# Patient Record
Sex: Male | Born: 1955 | Race: Black or African American | Hispanic: No | Marital: Married | Smoking: Never smoker
Health system: Southern US, Community
[De-identification: ages and names within clinical notes are randomized; demographics above are authoritative.]

## PROBLEM LIST (undated history)

## (undated) HISTORY — PX: HIP SURGERY: SHX245

---

## 2002-01-06 ENCOUNTER — Encounter: Payer: Self-pay | Admitting: Family Medicine

## 2002-01-06 ENCOUNTER — Ambulatory Visit (HOSPITAL_COMMUNITY): Admission: RE | Admit: 2002-01-06 | Discharge: 2002-01-06 | Payer: Self-pay | Admitting: Family Medicine

## 2010-07-03 ENCOUNTER — Encounter: Payer: Self-pay | Admitting: Family Medicine

## 2012-04-01 ENCOUNTER — Ambulatory Visit: Payer: BC Managed Care – PPO

## 2012-04-01 ENCOUNTER — Ambulatory Visit (INDEPENDENT_AMBULATORY_CARE_PROVIDER_SITE_OTHER): Payer: BC Managed Care – PPO | Admitting: Internal Medicine

## 2012-04-01 VITALS — BP 130/74 | HR 50 | Temp 98.2°F | Resp 16 | Ht 69.0 in | Wt 205.2 lb

## 2012-04-01 DIAGNOSIS — M25559 Pain in unspecified hip: Secondary | ICD-10-CM

## 2012-04-01 DIAGNOSIS — Z96649 Presence of unspecified artificial hip joint: Secondary | ICD-10-CM

## 2012-04-01 NOTE — Progress Notes (Signed)
  Subjective:    Patient ID: Spencer Campbell, male    DOB: [birth year], 56 y.o.   MRN: [id number]  CC: 56 yo AA male c/o R hip pain.  HPI Pt. Had his R hip resurfaced 11/28/10.  Soon after the sx he was diagnosed with a hairline fx of the femur.  Recently (2 mths ago) he started a jogging program, and over the past 2 weeks he has noticed worsening pain in his R hip.  The onset of pain is with initiating movement, such as standing from a seated position.  He describes the pain as sharp then stiff.  The pain resolves with further movement and does not bother him while running.  The pt is concerned that he may have another hairline fracture and requests X-ray.  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 Vitals:  Filed Vitals:   04/01/12 1602  BP: 130/74  Pulse: 50  Temp: 98.2 F (36.8 C)  Resp: 16  HEENT: nontraumatic, EOMIT, normal to external exam, trachea midline Heart: bradycardic Lungs: No respiratory distress, no audible wheezing MSK: Normal bulk and tone.  R HIP: Pain in groin with FABER, decreased flexion strength compared to L. Neuro: Alert, oriented, CN II - XII grossly IT, LE reflexes 2/4. Gait: Hesitation/guarding when transferring from seated to standing position.  Initially walks with limp/decreased R sided weight bearing time, And returns to normal gait after 20 steps.    [facility] reading (PRIMARY) by  Dr. Elis Rawlinson=Lots of chronic changes in the area of surgery but no definite abnormality seen      Assessment & Plan:  1) tendonopathy  of hip flexors:  -continue jogging program -add stretching and ice as directed -patient education regarding diagnosis - RTC in 4-6 wks if not resolved or if not making graded improvement -If not better-back to ortho at [facility]

## 2012-04-02 ENCOUNTER — Telehealth: Payer: Self-pay | Admitting: Internal Medicine

## 2012-04-02 NOTE — Telephone Encounter (Signed)
Patient had xrays, needs to be advised of report. Have called numbers given and unable to get through. [phone] is another # listed on his paperwork from yesterday. Have left message for him to call back about this.

## 2012-04-03 ENCOUNTER — Telehealth: Payer: Self-pay | Admitting: Family Medicine

## 2012-04-03 NOTE — Telephone Encounter (Signed)
Advised pt of the results and inst's to f/up w/ortho Careers adviser. Pt agreed. Faxed copy of report to pt at his request.

## 2012-04-03 NOTE — Telephone Encounter (Signed)
Left message to call back  

## 2012-04-03 NOTE — Telephone Encounter (Signed)
See other message-needs f/u his ortho because of possible separation of prosthesis

## 2012-04-03 NOTE — Telephone Encounter (Signed)
Someone already spoke with patient about this matter

## 2012-04-03 NOTE — Telephone Encounter (Signed)
Dr Merla Riches, can you please comment on x-ray report so that I can call the pt back. It looks like there is no evidence of any hairline fx, but it mentions a sclerotic lesion. Please advise. Pt would like a call back at work # [phone] and have him paged.

## 2012-06-19 ENCOUNTER — Ambulatory Visit (INDEPENDENT_AMBULATORY_CARE_PROVIDER_SITE_OTHER): Payer: BC Managed Care – PPO | Admitting: Emergency Medicine

## 2012-06-19 VITALS — BP 112/64 | HR 109 | Temp 101.5°F | Resp 16 | Ht 69.0 in | Wt 199.0 lb

## 2012-06-19 DIAGNOSIS — R05 Cough: Secondary | ICD-10-CM

## 2012-06-19 DIAGNOSIS — J111 Influenza due to unidentified influenza virus with other respiratory manifestations: Secondary | ICD-10-CM

## 2012-06-19 DIAGNOSIS — R509 Fever, unspecified: Secondary | ICD-10-CM

## 2012-06-19 LAB — POCT INFLUENZA A/B
Influenza A, POC: POSITIVE
Influenza B, POC: NEGATIVE

## 2012-06-19 MED ORDER — OSELTAMIVIR PHOSPHATE 75 MG PO CAPS: 75.0000 mg | ORAL_CAPSULE | Freq: Two times a day (BID) | ORAL | Status: DC

## 2012-06-19 MED ORDER — HYDROCOD POLST-CHLORPHEN POLST 10-8 MG/5ML PO LQCR: 5.0000 mL | Freq: Two times a day (BID) | ORAL | Status: DC | PRN

## 2012-06-19 NOTE — Patient Instructions (Signed)

## 2012-06-19 NOTE — Progress Notes (Signed)
[facility] [address] [place] [address] [phone]  Date:  06/19/2012   Name:  Spencer Campbell   DOB:  Mar 29, 1956   MRN:  [id number]  PCP:  No primary provider on file.    Chief Complaint: Cough and Fever   History of Present Illness:  Spencer Campbell is a 57 y.o. very pleasant male patient who presents with the following:  Ill since yesterday with fever and chills, malaise, myalgias.  Has a cough that is not productive.  No wheezing or shortness of breath.  No nausea or vomiting.  No improvement with OTC meds.  Exposed to flu extensively at work.  Patient Active Problem List  Diagnosis  . S/P hip replacement    History reviewed. No pertinent past medical history.  Past Surgical History  Procedure Date  . Hip surgery     History  Substance Use Topics  . Smoking status: Never Smoker   . Smokeless tobacco: Not on file  . Alcohol Use: Not on file    History reviewed. No pertinent family history.  No Known Allergies  Medication list has been reviewed and updated.  No current outpatient prescriptions on file prior to visit.    Review of Systems:  As per HPI, otherwise negative.    Physical Examination: Filed Vitals:   06/19/12 1533  BP: 112/64  Pulse: 109  Temp: 101.5 F (38.6 C)  Resp: 16   Filed Vitals:   06/19/12 1533  Height: 5\' 9"  (1.753 m)  Weight: 199 lb (90.266 kg)   Body mass index is 29.39 kg/(m^2). Ideal Body Weight: Weight in (lb) to have BMI = 25: 168.9   GEN: WDWN, NAD, Non-toxic, A & O x 3 HEENT: Atraumatic, Normocephalic. Neck supple. No masses, No LAD. Ears and Nose: No external deformity. CV: RRR, No M/G/R. No JVD. No thrill. No extra heart sounds. PULM: CTA B, no wheezes, crackles, rhonchi. No retractions. No resp. distress. No accessory muscle use. ABD: S, NT, ND, +BS. No rebound. No HSM. EXTR: No c/c/e NEURO Normal gait.  PSYCH: Normally interactive. Conversant. Not depressed or anxious  appearing.  Calm demeanor.    Assessment and Plan: Influenza tamiflu tussionex   Carmelina Dane, MD  Results for orders placed in visit on 06/19/12  POCT INFLUENZA A/B      Component Value Range   Influenza A, POC Positive     Influenza B, POC Negative

## 2012-06-26 ENCOUNTER — Other Ambulatory Visit: Payer: Self-pay | Admitting: Emergency Medicine

## 2012-06-27 ENCOUNTER — Other Ambulatory Visit: Payer: Self-pay | Admitting: Emergency Medicine

## 2012-06-27 NOTE — Telephone Encounter (Signed)
AT THE PHARMACY, REFILL WAS DENIED BUT WAS TOLD BY PHARMACIST THAT A NEW RX WAS TO COME THRU FROM OUR OFFICE BUT HAS NOT COME YET. COUGH SYRUP....  CALLBACK #- [phone]

## 2012-06-28 ENCOUNTER — Telehealth: Payer: Self-pay

## 2012-06-28 ENCOUNTER — Other Ambulatory Visit: Payer: Self-pay

## 2012-06-28 NOTE — Telephone Encounter (Signed)
Refill is ok with me.  thanks

## 2012-06-28 NOTE — Telephone Encounter (Signed)
PT CALLING ABOUT RX AT [facility] THAT WAS NOT THERE WHEN HE WENT TO PICK IT UP; PHARMACY SAID OUR OFFICE WAS SENDING ANOTHER COPY OVER BUT IT HASNT BEEN RECEIVED YET.   [phone]

## 2012-06-28 NOTE — Telephone Encounter (Signed)
This one also routed to the Rx refill area, instead of patient calls.

## 2012-06-28 NOTE — Telephone Encounter (Signed)
This was already sent in my rx request.

## 2012-06-30 NOTE — Telephone Encounter (Signed)
tussionex was sent, left message for patient to advise.

## 2014-09-11 ENCOUNTER — Ambulatory Visit (INDEPENDENT_AMBULATORY_CARE_PROVIDER_SITE_OTHER): Payer: BC Managed Care – PPO | Admitting: Family Medicine

## 2014-09-11 VITALS — BP 110/68 | HR 58 | Temp 97.8°F | Resp 16 | Ht 69.0 in | Wt 204.4 lb

## 2014-09-11 DIAGNOSIS — L03011 Cellulitis of right finger: Secondary | ICD-10-CM | POA: Diagnosis not present

## 2014-09-11 MED ORDER — DOXYCYCLINE HYCLATE 100 MG PO TABS: 100.0000 mg | ORAL_TABLET | Freq: Two times a day (BID) | ORAL | Status: DC

## 2014-09-11 NOTE — Progress Notes (Signed)
This chart was scribed for Dr. Elvina SidleKurt Lauenstein, MD by Jarvis Morganaylor Ferguson, Medical Scribe. This patient was seen in Room 5 and the patient's care was started at 10:46 AM.  Patient ID: Spencer Campbell MRN: [id number], DOB: 08-23-1955, 59 y.o. Date of Encounter: 09/11/2014, 10:57 AM  Primary Physician: No primary care provider on file.  Chief Complaint:  Chief Complaint  Patient presents with   Hand Pain    1st digit Right hand possibly infected    HPI: 59 y.o. year old male with history below presents with throbbing and sore right thumb pain for 2 days. Pt states he recently cut his nails short and he believes that could be contributory to the pain. He denies any injury to the area. There is associated mild redness to the tip of the thumb and he also states that the area is hardened. He states he has been soaking in Epson salt with only mild relief. Pt is right handed. He denies any other issues at this time.  Pt works as Tax inspectorAssistant Principal and [employer]   History reviewed. No pertinent past medical history.   Home Meds: Prior to Admission medications   Medication Sig Start Date End Date Taking? Authorizing Provider  chlorpheniramine-HYDROcodone (TUSSIONEX) 10-8 MG/5ML LQCR Take 5 mLs by mouth every 12 (twelve) hours as needed. Needs office visit for more Patient not taking: Reported on 09/11/2014 06/26/12   Nelva NayHeather M Marte, PA-C  oseltamivir (TAMIFLU) 75 MG capsule Take 1 capsule (75 mg total) by mouth 2 (two) times daily. Patient not taking: Reported on 09/11/2014 06/19/12   Carmelina DaneJeffery S Anderson, MD    Allergies: No Known Allergies  History   Social History   Marital Status: Married    Spouse Name: N/A   Number of Children: N/A   Years of Education: N/A   Occupational History   Not on file.   Social History Main Topics   Smoking status: Never Smoker    Smokeless tobacco: Not on file   Alcohol Use: Not on file   Drug Use: Not on file   Sexual  Activity: Not on file   Other Topics Concern   Not on file   Social History Narrative     Review of Systems: Constitutional: negative for chills, fever, night sweats, weight changes, or fatigue  HEENT: negative for vision changes, hearing loss, congestion, rhinorrhea, ST, epistaxis, or sinus pressure Cardiovascular: negative for chest pain or palpitations Respiratory: negative for hemoptysis, wheezing, shortness of breath, or cough Abdominal: negative for abdominal pain, nausea, vomiting, diarrhea, or constipation Dermatological: positive for (mild) redness to the thumb. negative for rash Neurologic: negative for headache, dizziness, or syncope Musk: positive for arthralgias  All other systems reviewed and are otherwise negative with the exception to those above and in the HPI.   Physical Exam: Blood pressure 110/68, pulse 58, temperature 97.8 F (36.6 C), temperature source Oral, resp. rate 16, height 5\' 9"  (1.753 m), weight 204 lb 6.4 oz (92.715 kg), SpO2 99 %., Body mass index is 30.17 kg/(m^2). General: Well developed, well nourished, in no acute distress. Head: Normocephalic, atraumatic, eyes without discharge, sclera non-icteric, nares are without discharge. Bilateral auditory canals clear, TM's are without perforation, pearly grey and translucent with reflective cone of light bilaterally. Oral cavity moist, posterior pharynx without exudate, erythema, peritonsillar abscess, or post nasal drip.  Neck: Supple. No thyromegaly. Full ROM. No lymphadenopathy. Lungs: Clear bilaterally to auscultation without wheezes, rales, or rhonchi. Breathing is unlabored. Heart: RRR with  S1 S2. No murmurs, rubs, or gallops appreciated. Abdomen: Soft, non-tender, non-distended with normoactive bowel sounds. No hepatomegaly. No rebound/guarding. No obvious abdominal masses. Msk:  Strength and tone normal for age. Central paronychia at tip of right tumb this was I&D Extremities/Skin: Warm and dry. No  clubbing or cyanosis. No edema. No rashes or suspicious lesions. Neuro: Alert and oriented X 3. Moves all extremities spontaneously. Gait is normal. CNII-XII grossly in tact. Psych:  Responds to questions appropriately with a normal affect.   Thumb I&D'd with yellow pus expressed   ASSESSMENT AND PLAN:  59 y.o. year old male with   Paronychia of thumb, right - Plan: doxycycline (VIBRA-TABS) 100 MG tablet  Meds ordered this encounter  Medications   doxycycline (VIBRA-TABS) 100 MG tablet    Sig: Take 1 tablet (100 mg total) by mouth 2 (two) times daily.    Dispense:  20 tablet    Refill:  0   This chart was scribed in my presence and reviewed by me personally.    ICD-9-CM ICD-10-CM   1. Paronychia of thumb, right 681.02 L03.011 doxycycline (VIBRA-TABS) 100 MG tablet     Signed, Elvina Sidle, MD 09/11/2014 10:57 AM

## 2014-09-11 NOTE — Patient Instructions (Signed)

## 2015-06-21 ENCOUNTER — Ambulatory Visit (INDEPENDENT_AMBULATORY_CARE_PROVIDER_SITE_OTHER): Payer: BC Managed Care – PPO

## 2015-06-21 ENCOUNTER — Ambulatory Visit (INDEPENDENT_AMBULATORY_CARE_PROVIDER_SITE_OTHER): Payer: BC Managed Care – PPO | Admitting: Urgent Care

## 2015-06-21 VITALS — BP 110/72 | HR 66 | Temp 98.0°F | Resp 18 | Ht 69.0 in | Wt 210.8 lb

## 2015-06-21 DIAGNOSIS — M25561 Pain in right knee: Secondary | ICD-10-CM

## 2015-06-21 DIAGNOSIS — M1611 Unilateral primary osteoarthritis, right hip: Secondary | ICD-10-CM | POA: Diagnosis not present

## 2015-06-21 MED ORDER — NAPROXEN SODIUM 550 MG PO TABS: 550.0000 mg | ORAL_TABLET | Freq: Two times a day (BID) | ORAL | Status: AC

## 2015-06-21 NOTE — Progress Notes (Signed)
    MRN: [id number] DOB: [birth year]  Subjective:   Spencer Campbell is a 60 y.o. male presenting for chief complaint of Knee Pain  Reports ~2 week history of right knee pain. Pain is constant, located behind his knee cap, is achy and sharp, worse with tapping his knee and increased activity. Has tried Alleve, has not helped but he admits that he has also not changed his activity. Denies fever, trauma, redness, swelling, hearing popping or tearing noises. Admits history of left knee pain, saw an orthopedist for this ~20 years ago and had his joint space cleaned out. He also has history of partial right hip replacement in 2012. Patient works as an Tax inspectorassistant principal of high school. Has played a lot of sports in his life. Denies history of right knee surgery.  Spencer Campbell currently has no medications in their medication list. Also has No Known Allergies.  Spencer Campbell  has no past medical history on file. Also  has past surgical history that includes Hip surgery.  Objective:   Vitals: BP 110/72 mmHg  Pulse 66  Temp(Src) 98 F (36.7 C) (Oral)  Resp 18  Ht 5\' 9"  (1.753 m)  Wt 210 lb 12.8 oz (95.618 kg)  BMI 31.12 kg/m2  SpO2 98%  Physical Exam  Constitutional: He is oriented to person, place, and time. He appears well-developed and well-nourished.  Cardiovascular: Normal rate.   Pulmonary/Chest: Effort normal.  Musculoskeletal:       Right knee: He exhibits normal range of motion, no swelling, no effusion, no ecchymosis, no deformity, no laceration, no erythema, normal alignment and normal patellar mobility. No tenderness found. No medial joint line, no lateral joint line, no MCL, no LCL and no patellar tendon tenderness noted.  Neurological: He is alert and oriented to person, place, and time.  Skin: Skin is warm and dry. No rash noted. No erythema. No pallor.  Psychiatric: He has a normal mood and affect.   [facility] reading (PRIMARY) by PA-Saydee Zolman. Right knee - degenerative changes, mild medial  joint space narrowing, please comment.  Assessment and Plan :   1. Osteoarthritis of right hip, unspecified osteoarthritis type 2. Right knee pain - Will undergo conservative management for now. Anaprox for pain and inflammation, wear reaction knee brace. If no improvement in 2 weeks, refer to Dr. Thea SilversmithMacKenzie for further evaluation.  Wallis BambergMario Charene Mccallister, PA-C [facility] [phone] 06/21/2015 4:08 PM

## 2015-06-21 NOTE — Patient Instructions (Signed)

## 2016-11-16 IMAGING — CR DG KNEE COMPLETE 4+V*R*
4 series · 4 of 4 positions shown · non-contrast
Comparison: MRI May 15, 2007.

CLINICAL DATA: Right knee pain without known injury.

EXAM:
RIGHT KNEE - COMPLETE 4+ VIEW

[AP]
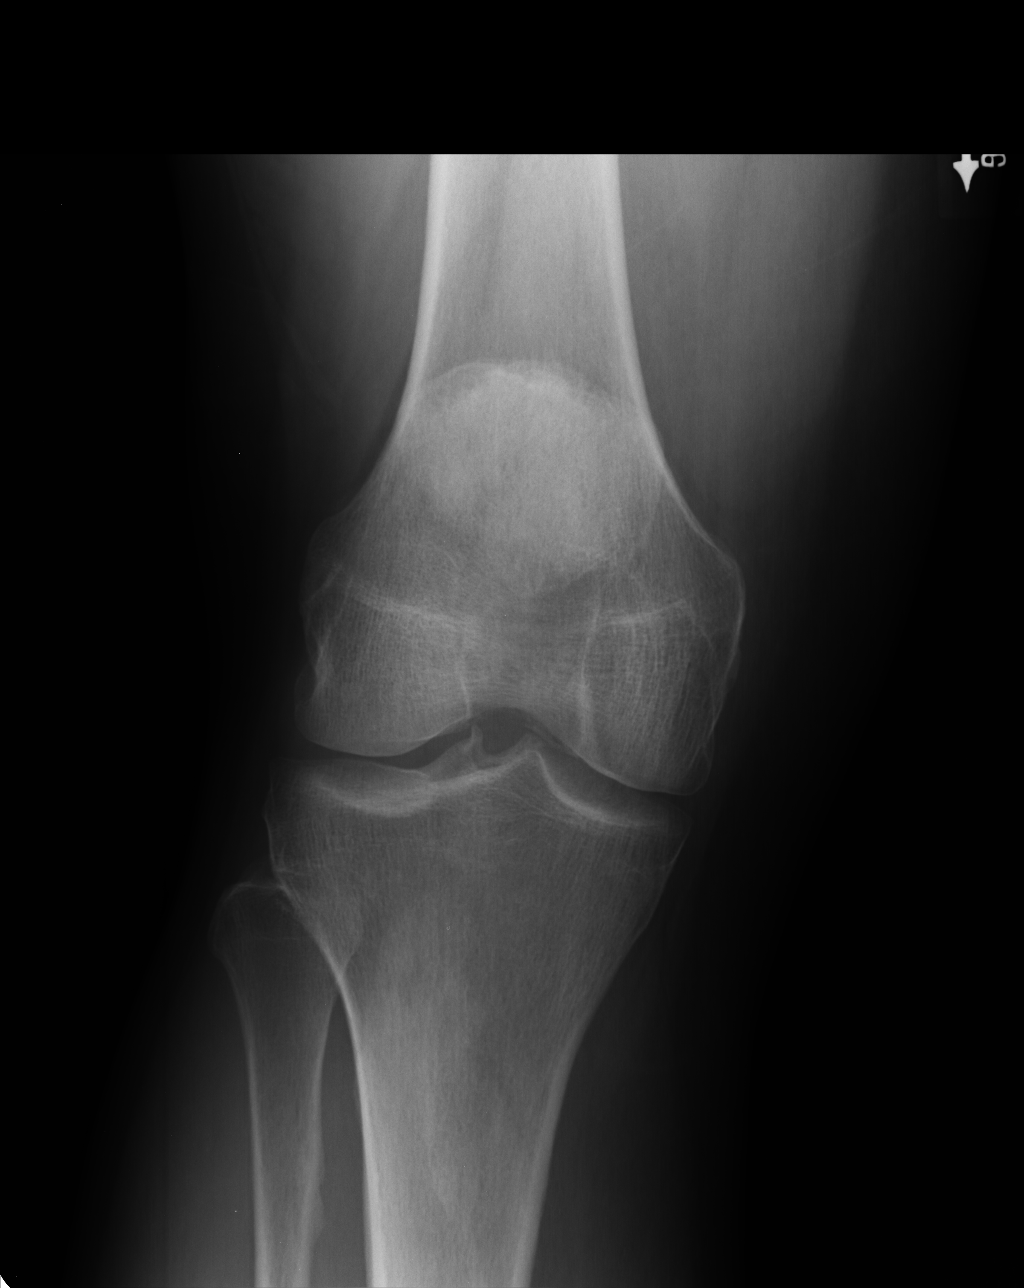

[ap axial]
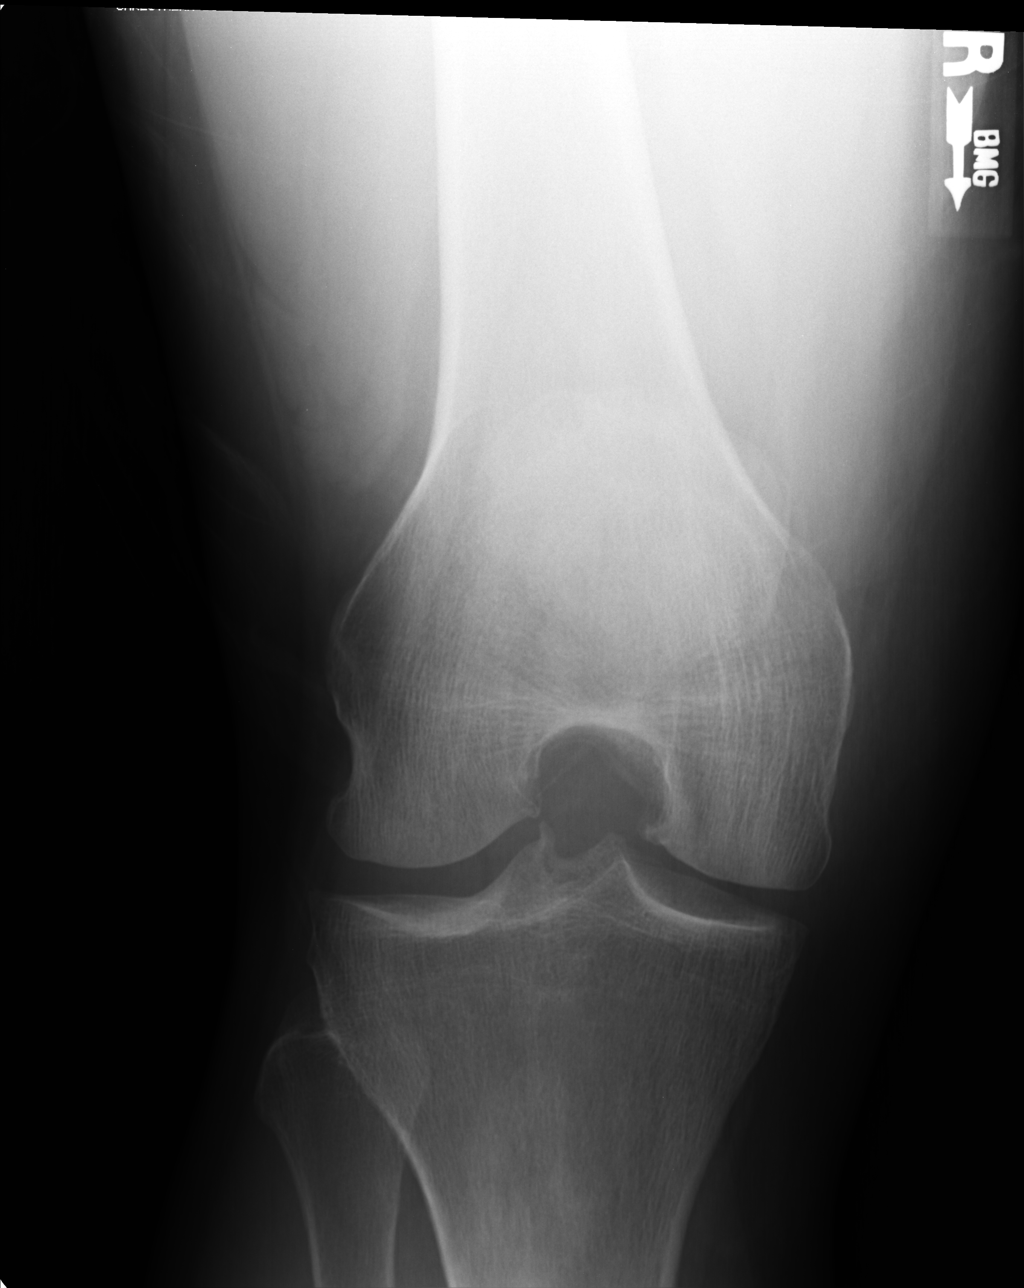

[lateral]
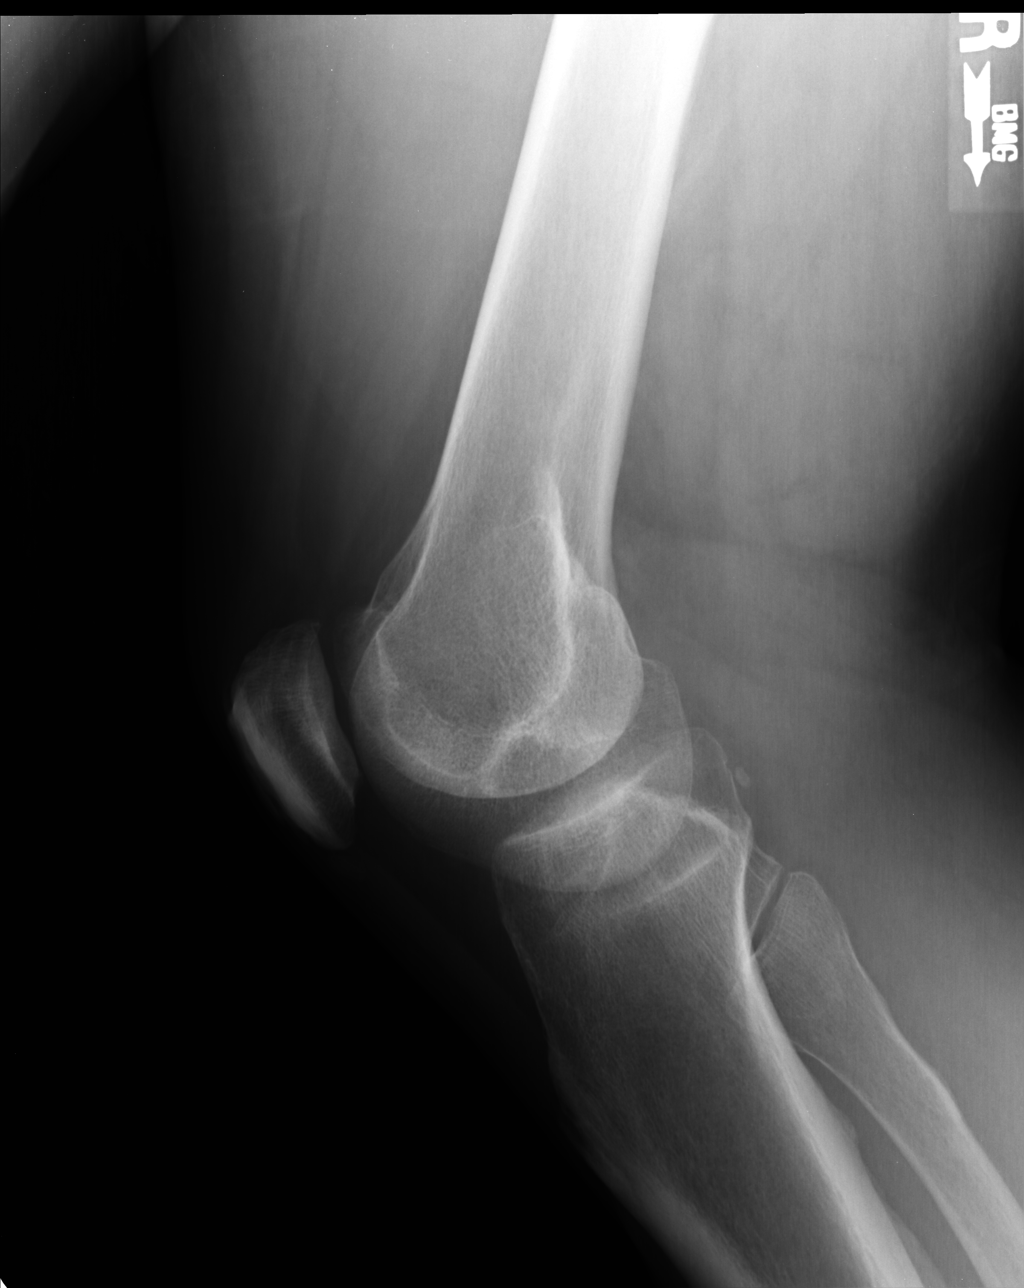

[sunrise]
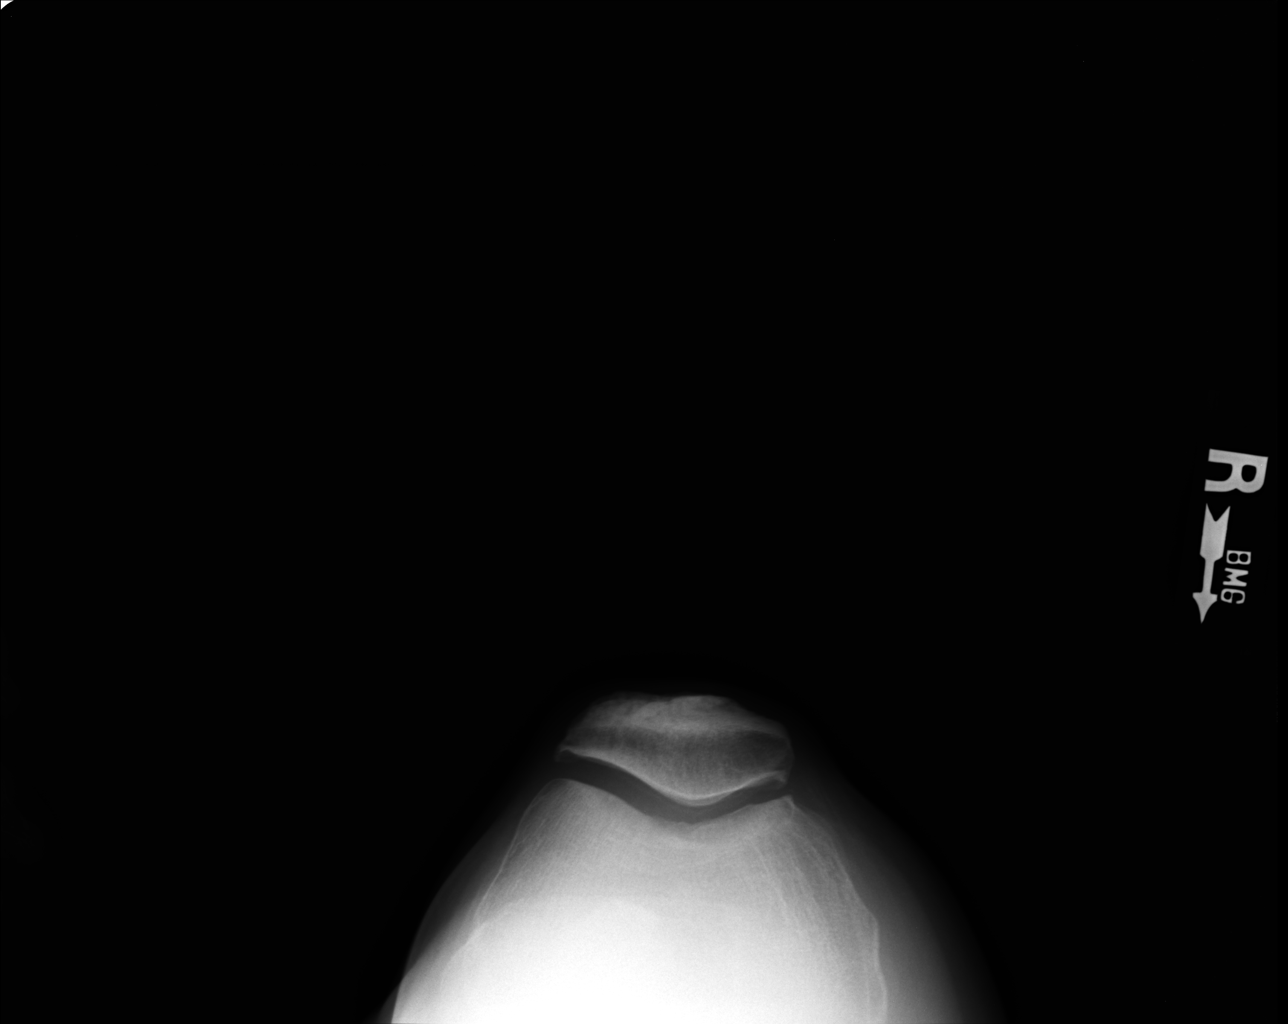

[4 of 4 positions shown; findings below may reference images not displayed]

FINDINGS: There is no evidence of fracture, dislocation, or joint effusion. No
significant joint space narrowing is noted. Mild spurring of tibial
spines is noted. Soft tissues are unremarkable.
IMPRESSION: Mild degenerative changes are noted no acute abnormality seen in the
right knee.

## 2019-08-09 ENCOUNTER — Ambulatory Visit: Payer: Self-pay | Attending: Internal Medicine

## 2019-08-09 DIAGNOSIS — Z23 Encounter for immunization: Secondary | ICD-10-CM | POA: Insufficient documentation

## 2019-08-09 NOTE — Progress Notes (Signed)
   Covid-19 Vaccination Clinic  Name:  Spencer Campbell    MRN: [id number] DOB: 26-Dec-1955  08/09/2019  Spencer Campbell was observed post Covid-19 immunization for 15 minutes without incidence. He was provided with Vaccine Information Sheet and instruction to access the V-Safe system.   Spencer Campbell was instructed to call 911 with any severe reactions post vaccine: Marland Kitchen Difficulty breathing  . Swelling of your face and throat  . A fast heartbeat  . A bad rash all over your body  . Dizziness and weakness    Immunizations Administered    Name Date Dose VIS Date Route   Pfizer COVID-19 Vaccine 08/09/2019 12:30 PM 0.3 mL 05/23/2019 Intramuscular   Manufacturer: [employer], [employer]   Lot: [id number]   NDC: [address]-5

## 2019-08-30 ENCOUNTER — Ambulatory Visit: Payer: Self-pay | Attending: Internal Medicine

## 2019-08-30 DIAGNOSIS — Z23 Encounter for immunization: Secondary | ICD-10-CM

## 2019-08-30 NOTE — Progress Notes (Signed)
   Covid-19 Vaccination Clinic  Name:  Xaidyn Kepner    MRN: [id number] DOB: 1955-06-22  08/30/2019  Mr. Buenger was observed post Covid-19 immunization for 15 minutes without incident. He was provided with Vaccine Information Sheet and instruction to access the V-Safe system.   Mr. Llamas was instructed to call 911 with any severe reactions post vaccine: Marland Kitchen Difficulty breathing  . Swelling of face and throat  . A fast heartbeat  . A bad rash all over body  . Dizziness and weakness   Immunizations Administered    Name Date Dose VIS Date Route   Pfizer COVID-19 Vaccine 08/30/2019  1:14 PM 0.3 mL 05/23/2019 Intramuscular   Manufacturer: [employer], [employer]   Lot: [id number]   NDC: [address]-3

## 2019-09-03 ENCOUNTER — Ambulatory Visit: Payer: Self-pay
